# Patient Record
Sex: Male | Born: 1935 | Race: Black or African American | Hispanic: No
Health system: Southern US, Community
[De-identification: ages and names within clinical notes are randomized; demographics above are authoritative.]

---

## 2001-04-15 ENCOUNTER — Encounter: Admission: RE | Admit: 2001-04-15 | Discharge: 2001-04-15 | Payer: Self-pay | Admitting: Cardiology

## 2001-04-15 ENCOUNTER — Encounter: Payer: Self-pay | Admitting: Cardiology

## 2001-12-10 ENCOUNTER — Encounter: Admission: RE | Admit: 2001-12-10 | Discharge: 2001-12-10 | Payer: Self-pay | Admitting: Urology

## 2001-12-10 ENCOUNTER — Encounter: Payer: Self-pay | Admitting: Urology

## 2002-12-02 ENCOUNTER — Ambulatory Visit (HOSPITAL_COMMUNITY): Admission: RE | Admit: 2002-12-02 | Discharge: 2002-12-02 | Payer: Self-pay | Admitting: Cardiology

## 2003-05-20 ENCOUNTER — Ambulatory Visit (HOSPITAL_COMMUNITY): Admission: RE | Admit: 2003-05-20 | Discharge: 2003-05-20 | Payer: Self-pay | Admitting: Urology

## 2003-11-14 ENCOUNTER — Inpatient Hospital Stay (HOSPITAL_COMMUNITY): Admission: EM | Admit: 2003-11-14 | Discharge: 2003-11-16 | Payer: Self-pay

## 2003-11-21 ENCOUNTER — Encounter: Admission: RE | Admit: 2003-11-21 | Discharge: 2003-11-21 | Payer: Self-pay | Admitting: Family Medicine

## 2003-12-07 ENCOUNTER — Inpatient Hospital Stay (HOSPITAL_COMMUNITY): Admission: AD | Admit: 2003-12-07 | Discharge: 2003-12-10 | Payer: Self-pay | Admitting: Surgery

## 2004-06-06 ENCOUNTER — Ambulatory Visit (HOSPITAL_COMMUNITY): Admission: RE | Admit: 2004-06-06 | Discharge: 2004-06-06 | Payer: Self-pay | Admitting: Surgery

## 2004-11-15 ENCOUNTER — Ambulatory Visit: Payer: Self-pay | Admitting: Internal Medicine

## 2004-11-21 ENCOUNTER — Inpatient Hospital Stay (HOSPITAL_COMMUNITY): Admission: EM | Admit: 2004-11-21 | Discharge: 2004-11-24 | Payer: Self-pay | Admitting: Internal Medicine

## 2004-11-21 ENCOUNTER — Ambulatory Visit: Payer: Self-pay | Admitting: Internal Medicine

## 2004-11-27 ENCOUNTER — Ambulatory Visit: Payer: Self-pay | Admitting: Internal Medicine

## 2004-11-28 ENCOUNTER — Ambulatory Visit: Payer: Self-pay | Admitting: Hematology and Oncology

## 2004-11-30 ENCOUNTER — Ambulatory Visit (HOSPITAL_COMMUNITY): Admission: RE | Admit: 2004-11-30 | Discharge: 2004-11-30 | Payer: Self-pay | Admitting: Hematology and Oncology

## 2004-12-04 ENCOUNTER — Ambulatory Visit: Admission: RE | Admit: 2004-12-04 | Discharge: 2005-02-18 | Payer: Self-pay | Admitting: Radiation Oncology

## 2005-01-24 ENCOUNTER — Ambulatory Visit: Payer: Self-pay | Admitting: Hematology and Oncology

## 2005-02-15 ENCOUNTER — Ambulatory Visit (HOSPITAL_COMMUNITY): Admission: RE | Admit: 2005-02-15 | Discharge: 2005-02-15 | Payer: Self-pay | Admitting: Urology

## 2005-03-04 ENCOUNTER — Ambulatory Visit (HOSPITAL_COMMUNITY): Admission: RE | Admit: 2005-03-04 | Discharge: 2005-03-04 | Payer: Self-pay | Admitting: Hematology and Oncology

## 2005-03-29 ENCOUNTER — Ambulatory Visit (HOSPITAL_COMMUNITY): Admission: RE | Admit: 2005-03-29 | Discharge: 2005-03-29 | Payer: Self-pay | Admitting: Cardiology

## 2005-04-03 ENCOUNTER — Ambulatory Visit: Payer: Self-pay | Admitting: Internal Medicine

## 2005-04-04 ENCOUNTER — Ambulatory Visit: Payer: Self-pay | Admitting: Internal Medicine

## 2005-04-05 ENCOUNTER — Ambulatory Visit: Payer: Self-pay | Admitting: Internal Medicine

## 2005-04-10 ENCOUNTER — Ambulatory Visit: Payer: Self-pay | Admitting: Internal Medicine

## 2005-04-11 ENCOUNTER — Ambulatory Visit: Payer: Self-pay | Admitting: Hematology and Oncology

## 2005-04-12 ENCOUNTER — Other Ambulatory Visit: Admission: RE | Admit: 2005-04-12 | Discharge: 2005-04-12 | Payer: Self-pay | Admitting: Hematology and Oncology

## 2005-04-12 ENCOUNTER — Ambulatory Visit: Payer: Self-pay | Admitting: Internal Medicine

## 2005-04-15 ENCOUNTER — Encounter (HOSPITAL_COMMUNITY): Admission: RE | Admit: 2005-04-15 | Discharge: 2005-07-12 | Payer: Self-pay | Admitting: Internal Medicine

## 2005-04-16 ENCOUNTER — Ambulatory Visit: Payer: Self-pay | Admitting: Hematology and Oncology

## 2005-04-29 ENCOUNTER — Ambulatory Visit (HOSPITAL_COMMUNITY): Admission: RE | Admit: 2005-04-29 | Discharge: 2005-04-29 | Payer: Self-pay | Admitting: Hematology and Oncology

## 2005-05-03 ENCOUNTER — Ambulatory Visit (HOSPITAL_COMMUNITY): Admission: RE | Admit: 2005-05-03 | Discharge: 2005-05-03 | Payer: Self-pay | Admitting: Cardiology

## 2005-05-05 ENCOUNTER — Inpatient Hospital Stay (HOSPITAL_COMMUNITY): Admission: EM | Admit: 2005-05-05 | Discharge: 2005-05-17 | Payer: Self-pay | Admitting: Family Medicine

## 2005-05-07 ENCOUNTER — Ambulatory Visit: Payer: Self-pay | Admitting: Internal Medicine

## 2005-05-27 ENCOUNTER — Encounter: Admission: RE | Admit: 2005-05-27 | Discharge: 2005-05-27 | Payer: Self-pay | Admitting: Cardiology

## 2005-06-07 ENCOUNTER — Emergency Department (HOSPITAL_COMMUNITY): Admission: EM | Admit: 2005-06-07 | Discharge: 2005-06-08 | Payer: Self-pay | Admitting: Emergency Medicine

## 2005-06-07 ENCOUNTER — Ambulatory Visit: Payer: Self-pay | Admitting: Hematology and Oncology

## 2005-06-18 ENCOUNTER — Inpatient Hospital Stay (HOSPITAL_COMMUNITY): Admission: AD | Admit: 2005-06-18 | Discharge: 2005-06-21 | Payer: Self-pay | Admitting: Internal Medicine

## 2005-06-18 ENCOUNTER — Ambulatory Visit: Payer: Self-pay | Admitting: Internal Medicine

## 2005-06-19 ENCOUNTER — Ambulatory Visit: Payer: Self-pay | Admitting: Internal Medicine

## 2005-06-24 ENCOUNTER — Ambulatory Visit: Payer: Self-pay | Admitting: Internal Medicine

## 2005-06-24 ENCOUNTER — Inpatient Hospital Stay (HOSPITAL_COMMUNITY): Admission: EM | Admit: 2005-06-24 | Discharge: 2005-07-13 | Payer: Self-pay | Admitting: Emergency Medicine

## 2005-06-29 ENCOUNTER — Ambulatory Visit: Payer: Self-pay | Admitting: Gastroenterology

## 2005-07-04 ENCOUNTER — Ambulatory Visit: Payer: Self-pay | Admitting: Hematology and Oncology

## 2005-08-08 ENCOUNTER — Ambulatory Visit: Payer: Self-pay | Admitting: Internal Medicine

## 2005-08-12 ENCOUNTER — Ambulatory Visit: Payer: Self-pay | Admitting: Internal Medicine

## 2006-04-08 IMAGING — CT CT ABDOMEN WO/W CM
2 of 5 series · 12 of 32 positions shown, 17 images · IV contrast (omnipaque)
Comparison: No ERCP 11/23/2004 and MRI abdomen 11/22/2004.

CLINICAL DATA: Recent diagnosis of cholangiocarcinoma. Previous imaging studies
are shown dilation of the pancreatic duct into the head of pancreas without
discrete mass. Dedicated pancreatic evaluation requested.

CT ABDOMEN WITHOUT AND WITH CONTRAST WITH ATTENTION TO THE PANCREAS 11/30/2004:
TECHNIQUE: Multidetector CT imaging of the abdomen was performed before and
during bolus administration of intravenous contrast. Oral contrast was given.
Delayed imaging through the pancreas and kidneys was performed.
Contrast:  125 cc Omnipaque 300

[Series 3: arterial_(id) 3.0 b40f st · axial · 0.64mm/px · z∈[+1540,+1730]mm · 7 of 90 slices shown]
[im 9/90  soft-tissue]
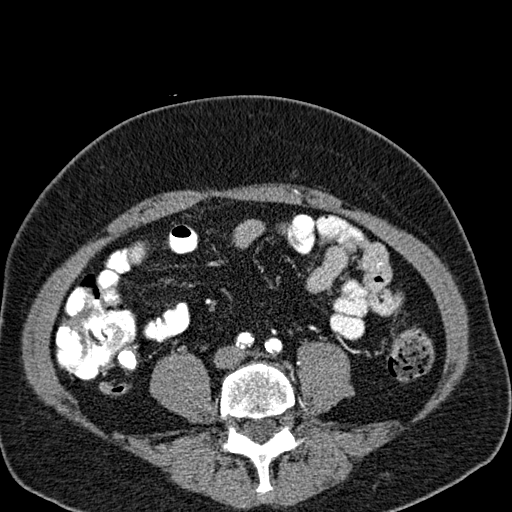
[im 18/90  soft-tissue]
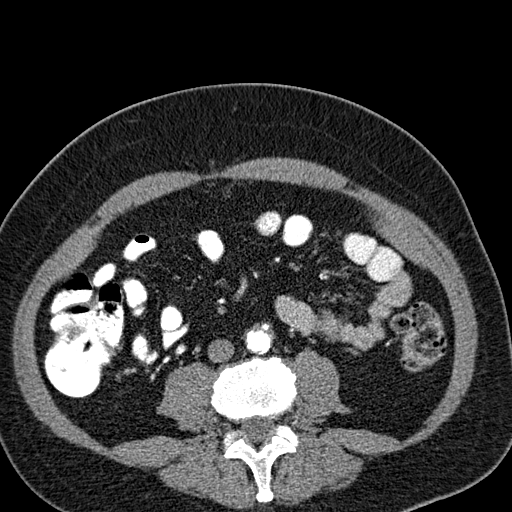
[im 27/90  soft-tissue]
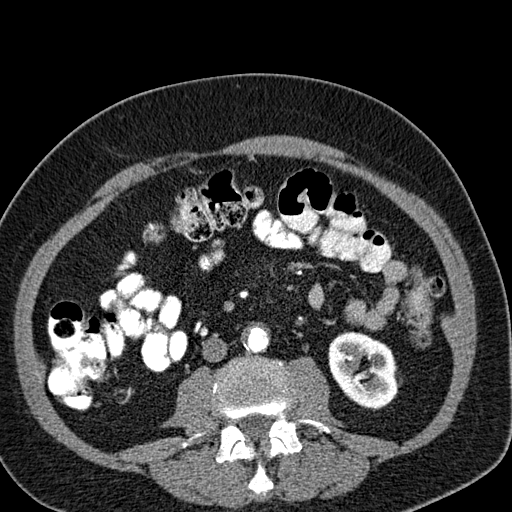
[im 36/90  soft-tissue]
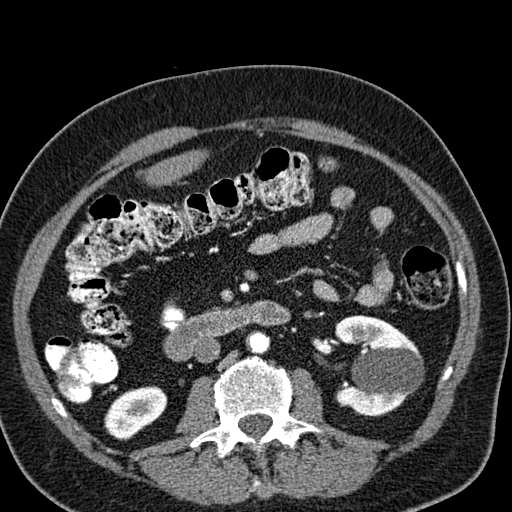
[im 54/90  soft-tissue]
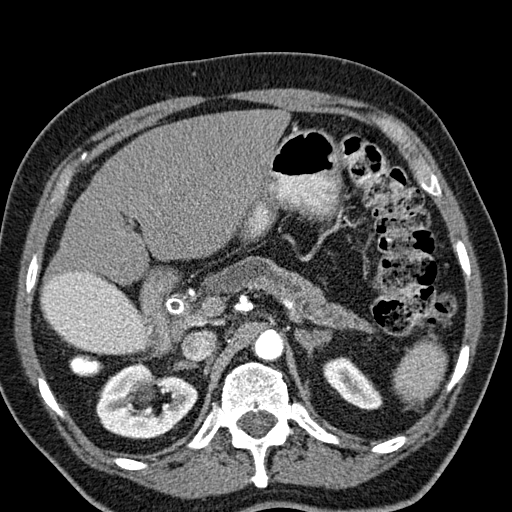
[im 63/90  soft-tissue]
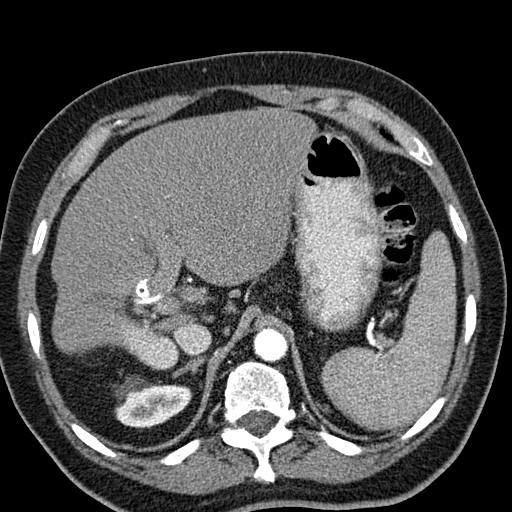
[im 72/90  soft-tissue]
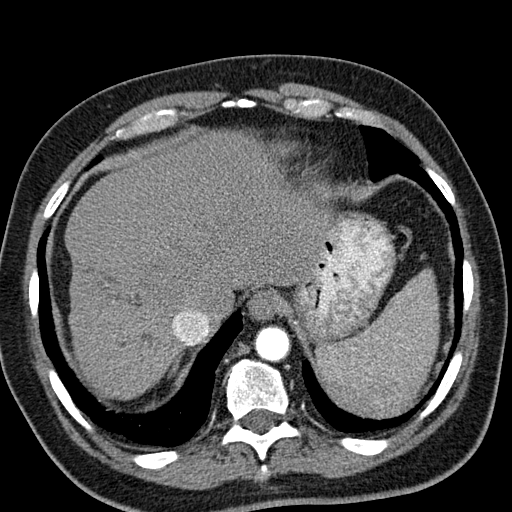

[Series 4: (id) 5.0 b40f · axial · 0.64mm/px · z∈[+1572,+1772]mm · 5 of 61 slices shown, 10 images]
[im 11/61  soft-tissue]
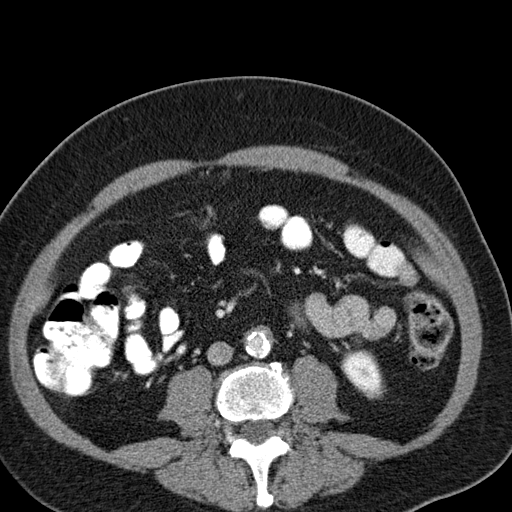
[im 11/61  bone]
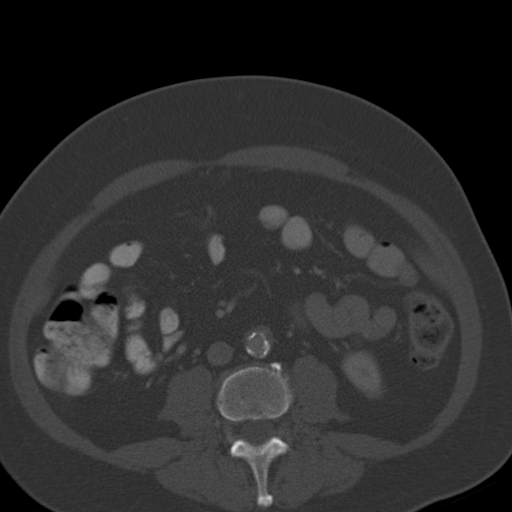
[im 21/61  soft-tissue]
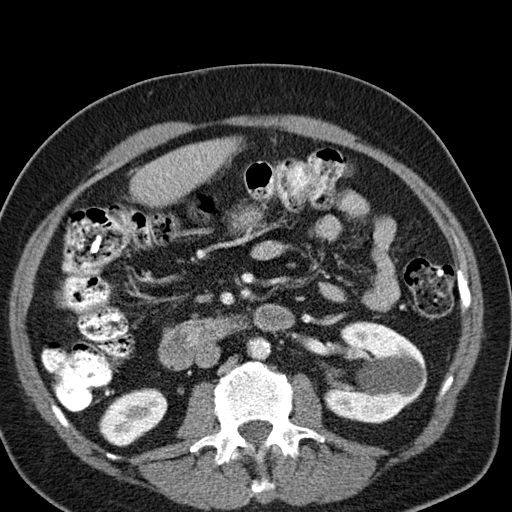
[im 21/61  lung]
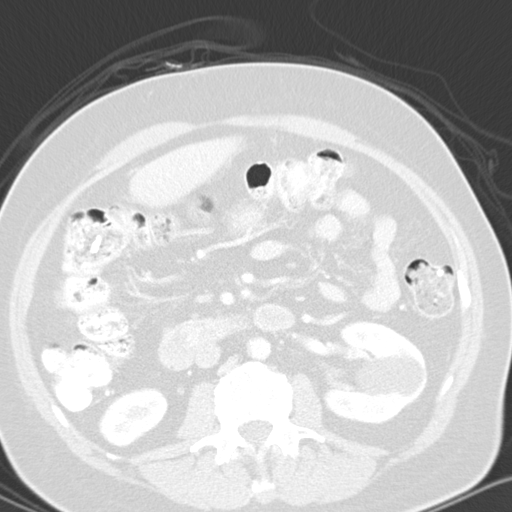
[im 31/61  soft-tissue]
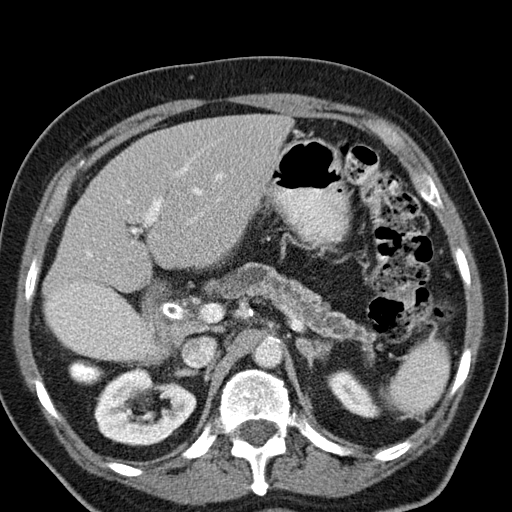
[im 31/61  lung]
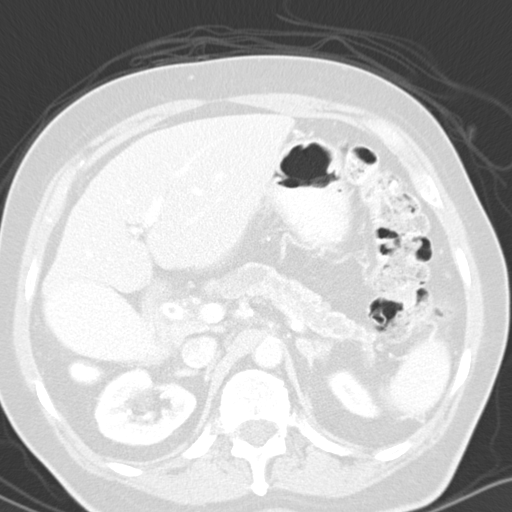
[im 41/61  soft-tissue]
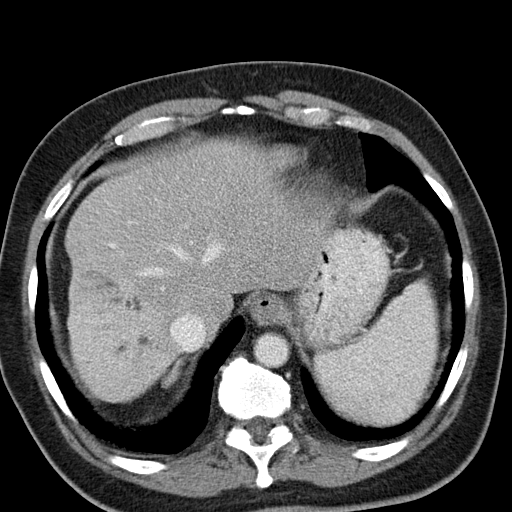
[im 41/61  lung]
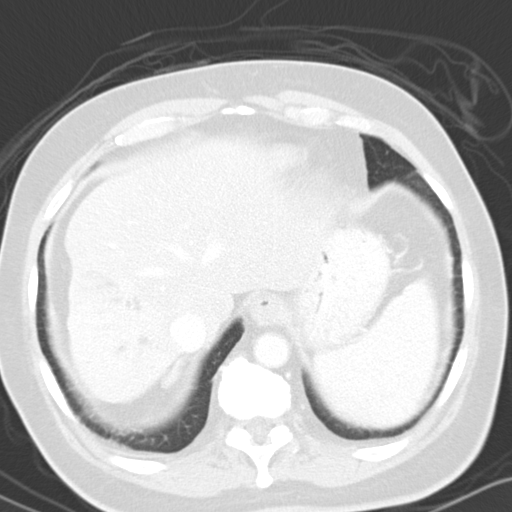
[im 51/61  soft-tissue]
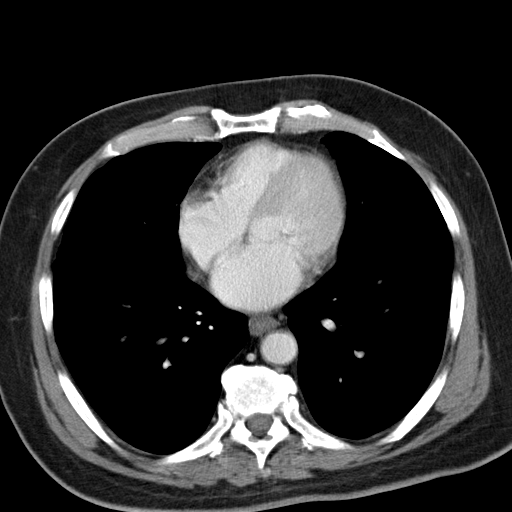
[im 51/61  lung]
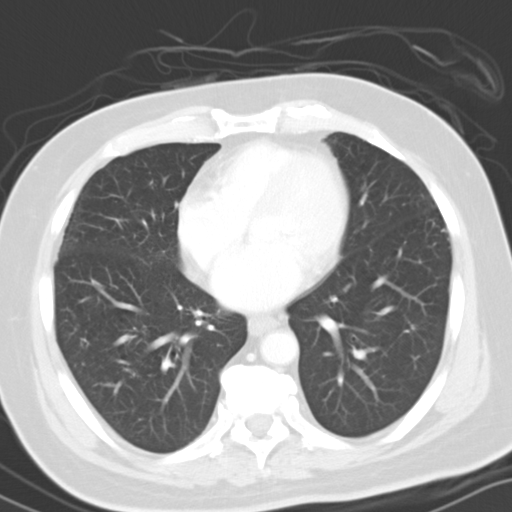

[12 of 32 positions shown; findings below may reference images not displayed]

FINDINGS: There is marked dilation of the pancreatic in the uncinate, body, and
tail of the pancreas, with associated parenchymal atrophy. The duct ends
abruptly in the pancreatic head, though I do not identify a discrete pancreatic
head mass; therefore, I suspect that there is an intraductal lesion that is just
not identifiable on cross-sectional imaging.

The indwelling biliary Wallstent is again demonstrated, and it appears to end
just above the ampulla. There is soft tissue surrounding the stent in the porta
hepatis, and this may represent the cholangiocarcinoma. The atrophy involving
the right lobe of the liver is again noted. The intrahepatic ductal dilation
within the right lobe is improved when compared to the previous MRI. There is no
left lobe ductal dilation. There has been compensatory enlargement of the left
lobe. The abnormal enhancement pattern of the right lobe of the liver is again
noted, as identified on the CT. No focal liver lesions are identified.

There is spurious excretion of contrast into the bile, so the gallbladder is of
high attenuation. There are small gallstones present in the gallbladder. The
spleen has a normal appearance. Both adrenal glands are normal. There are simple
cysts present in both kidneys, the largest in the lower pole of the left kidney
measuring approximately 4.8 x 2.9 cm. No solid renal lesions are identified and
there is no hydronephrosis. A small hiatal hernia is present. There is a large
amount of stool present throughout the visualized colon, and there are scattered
diverticula present throughout the colon. The small bowel has a normal
appearance. The appendix is identified in the right mid abdomen and is normal.
Abdominal aortic atherosclerosis is present without aneurysm. There is no
significant lymphadenopathy. There is no free fluid. Emphysematous changes are
present in the visualized lung bases; the visualized pulmonary parenchyma
appears clear.
IMPRESSION: 1. Marked dilation of the pancreatic duct in the tail, body, and uncinate. The
duct ends abruptly in the pancreatic head. No discrete mass is identified.
Therefore, I suspect there is an intraductal lesion which is not visualized on
cross-sectional imaging.
2. Indwelling biliary Wallstent. There has been interval improvement in the
right lobe intrahepatic ductal dilation since the MR abdomen 11/22/2004. The
markedly atrophic right lobe is again noted.
3. Soft tissue is identified surrounding the Wallstent in the porta hepatis,
possibly representing the cholangiocarcinoma.
4. No significant lymphadenopathy in the abdomen. No evidence of liver
metastasis.
5. Cholelithiasis.
6. Diffuse colonic diverticulosis. 
7. Small hiatal hernia.

## 2010-08-05 ENCOUNTER — Encounter: Payer: Self-pay | Admitting: Internal Medicine

## 2011-01-01 ENCOUNTER — Encounter: Payer: Self-pay | Admitting: Cardiology

## 2016-08-15 DEATH — deceased

## 2021-04-19 NOTE — Telephone Encounter (Signed)
Error
